# Patient Record
Sex: Male | Born: 1965 | Race: White | Hispanic: No | Marital: Married | Smoking: Never smoker
Health system: Southern US, Community
[De-identification: ages and names within clinical notes are randomized; demographics above are authoritative.]

## PROBLEM LIST (undated history)

## (undated) DIAGNOSIS — K5190 Ulcerative colitis, unspecified, without complications: Secondary | ICD-10-CM

---

## 2017-06-06 ENCOUNTER — Emergency Department (HOSPITAL_COMMUNITY)
Admission: EM | Admit: 2017-06-06 | Discharge: 2017-06-06 | Disposition: A | Discharge status: Home / Self Care | Payer: 59 | Attending: Emergency Medicine | Admitting: Emergency Medicine

## 2017-06-06 ENCOUNTER — Encounter (HOSPITAL_COMMUNITY): Payer: Self-pay

## 2017-06-06 DIAGNOSIS — Y999 Unspecified external cause status: Secondary | ICD-10-CM | POA: Insufficient documentation

## 2017-06-06 DIAGNOSIS — S0031XA Abrasion of nose, initial encounter: Secondary | ICD-10-CM | POA: Insufficient documentation

## 2017-06-06 DIAGNOSIS — Y929 Unspecified place or not applicable: Secondary | ICD-10-CM | POA: Diagnosis not present

## 2017-06-06 DIAGNOSIS — Y939 Activity, unspecified: Secondary | ICD-10-CM | POA: Insufficient documentation

## 2017-06-06 DIAGNOSIS — W548XXA Other contact with dog, initial encounter: Secondary | ICD-10-CM | POA: Insufficient documentation

## 2017-06-06 DIAGNOSIS — S0992XA Unspecified injury of nose, initial encounter: Secondary | ICD-10-CM | POA: Diagnosis present

## 2017-06-06 HISTORY — DX: Ulcerative colitis, unspecified, without complications: K51.90

## 2017-06-06 MED ORDER — AMOXICILLIN-POT CLAVULANATE 875-125 MG PO TABS: 1.0000 | ORAL_TABLET | Freq: Two times a day (BID) | ORAL | 0 refills | Status: DC

## 2017-06-06 NOTE — ED Triage Notes (Signed)
Per Pt, Pt was playing with puppy when he reached up with his claw and ripped through part of the pt's nose. Bleeding is controlled.

## 2017-06-06 NOTE — ED Provider Notes (Signed)
[facility] Provider Note   CSN: [id number] Arrival date & time: 06/06/17  1705     History   Chief Complaint Chief Complaint  Patient presents with  . Facial Laceration    HPI Ricardo Patterson is a 51 y.o. male.  The history is provided by the patient. No language interpreter was used.    Ricardo Patterson is a [age] y.o. male who presents to the Emergency Department complaining of nose laceration.  He was playing with his puppy that is fully immunized and sustained a cut to his nose about 2 hours prior to ED arrival.  He states that the puppy snags the side of his nose and he is not sure if it was with a tooth or a claw.  His tetanus shot was updated 2 years ago.  He has no medical problems.  He does have an aspirin allergy.  Symptoms are mild to moderate in nature.  Past Medical History:  Diagnosis Date  . Ulcerative colitis (HCC)     There are no active problems to display for this patient.   History reviewed. No pertinent surgical history.     Home Medications    Prior to Admission medications   Medication Sig Start Date End Date Taking? Authorizing Provider  amoxicillin-clavulanate (AUGMENTIN) 875-125 MG tablet Take 1 tablet every 12 (twelve) hours by mouth. 06/06/17   Tilden Fossaees, Marney Treloar, MD    Family History No family history on file.  Social History Social History   Tobacco Use  . Smoking status: Never Smoker  . Smokeless tobacco: Never Used  Substance Use Topics  . Alcohol use: No    Frequency: Never  . Drug use: No     Allergies   Aspirin   Review of Systems Review of Systems  All other systems reviewed and are negative.    Physical Exam Updated Vital Signs BP 106/78 (BP Location: Right Arm)   Pulse 70   Temp 97.9 F (36.6 C) (Oral)   Resp 18   Ht 5\' 8"  (1.727 m)   Wt 68 kg (150 lb)   SpO2 100%   BMI 22.81 kg/m   Physical Exam  Constitutional: He is oriented to person, place, and time. He  appears well-developed and well-nourished.  HENT:  Head: Normocephalic.  Mouth/Throat: Oropharynx is clear and moist.  Small laceration to the anterior nasal septum on the right.  Laceration is linear and less than half a centimeter in length and hemostatic with wound margins well approximated.  Eyes: EOM are normal. Pupils are equal, round, and reactive to light.  Neck: Neck supple.  Cardiovascular: Normal rate and regular rhythm.  Pulmonary/Chest: Effort normal. No respiratory distress.  Musculoskeletal: Normal range of motion.  Neurological: He is alert and oriented to person, place, and time.  Skin: Skin is warm.  Psychiatric: He has a normal mood and affect. His behavior is normal.  Nursing note and vitals reviewed.    ED Treatments / Results  Labs (all labs ordered are listed, but only abnormal results are displayed) Labs Reviewed - No data to display  EKG  EKG Interpretation None       Radiology No results found.  Procedures Procedures (including critical care time)  Medications Ordered in ED Medications - No data to display   Initial Impression / Assessment and Plan / ED Course  I have reviewed the triage vital signs and the nursing notes.  Pertinent labs & imaging results that were available during  my care of the patient were reviewed by me and considered in my medical decision making (see chart for details).     Patient here for evaluation of nasal injury following dog bite/scratch.  Wound was cleansed in the department with soap and water as well as chlorhexidine.  Margins are well approximated and wound is hemostatic.  Discussed with patient local wound care with topical bacitracin to the affected area twice daily until wound heals.  Wound does not need closed at this time.  Given it is unclear if this was a scratch or bite he will be placed on prophylactic antibiotics.  Discussed close return precautions for any evidence of developing infection.  Final  Clinical Impressions(s) / ED Diagnoses   Final diagnoses:  Abrasion of nose, initial encounter    ED Discharge Orders        Ordered    amoxicillin-clavulanate (AUGMENTIN) 875-125 MG tablet  Every 12 hours     06/06/17 1728       Tilden Fossaees, Nimai Burbach, MD 06/06/17 1732

## 2017-06-06 NOTE — ED Notes (Signed)
See MD assessment. 

## 2017-08-15 ENCOUNTER — Encounter (HOSPITAL_COMMUNITY): Payer: Self-pay | Admitting: Emergency Medicine

## 2017-08-15 DIAGNOSIS — E860 Dehydration: Secondary | ICD-10-CM | POA: Diagnosis present

## 2017-08-15 DIAGNOSIS — R3129 Other microscopic hematuria: Secondary | ICD-10-CM | POA: Diagnosis present

## 2017-08-15 DIAGNOSIS — Z23 Encounter for immunization: Secondary | ICD-10-CM

## 2017-08-15 DIAGNOSIS — A419 Sepsis, unspecified organism: Principal | ICD-10-CM | POA: Diagnosis present

## 2017-08-15 DIAGNOSIS — J019 Acute sinusitis, unspecified: Secondary | ICD-10-CM | POA: Diagnosis not present

## 2017-08-15 DIAGNOSIS — D649 Anemia, unspecified: Secondary | ICD-10-CM | POA: Diagnosis present

## 2017-08-15 DIAGNOSIS — Z886 Allergy status to analgesic agent status: Secondary | ICD-10-CM | POA: Diagnosis not present

## 2017-08-15 LAB — CBC
HEMATOCRIT: 41.6 % (ref 39.0–52.0)
HEMOGLOBIN: 14.2 g/dL (ref 13.0–17.0)
MCH: 28.9 pg (ref 26.0–34.0)
MCHC: 34.1 g/dL (ref 30.0–36.0)
MCV: 84.7 fL (ref 78.0–100.0)
Platelets: 459 10*3/uL — ABNORMAL HIGH (ref 150–400)
RBC: 4.91 MIL/uL (ref 4.22–5.81)
RDW: 14.2 % (ref 11.5–15.5)
WBC: 18.3 10*3/uL — ABNORMAL HIGH (ref 4.0–10.5)

## 2017-08-15 LAB — I-STAT CG4 LACTIC ACID, ED: LACTIC ACID, VENOUS: 2.67 mmol/L — AB (ref 0.5–1.9)

## 2017-08-15 MED ORDER — ACETAMINOPHEN 325 MG PO TABS
650.0000 mg | ORAL_TABLET | Freq: Once | ORAL | Status: AC | PRN
  Administered 2017-08-15: 650 mg via ORAL
  Filled 2017-08-15: qty 2

## 2017-08-15 MED ORDER — ALBUTEROL SULFATE (2.5 MG/3ML) 0.083% IN NEBU
5.0000 mg | INHALATION_SOLUTION | Freq: Once | RESPIRATORY_TRACT | Status: AC
  Administered 2017-08-15: 5 mg via RESPIRATORY_TRACT
  Filled 2017-08-15: qty 6

## 2017-08-15 NOTE — ED Triage Notes (Signed)
Reports having fever, chills, runny nose, cough, chest congestion, chest pain, SOB, and headache for a week.  Reports going to PCP and was told he had a cold to push fluids.  Reports he's not getting any better.  Noted to be dyspneic at rest with elevated temp 101.3 in triage.

## 2017-08-16 ENCOUNTER — Emergency Department (HOSPITAL_COMMUNITY): Payer: 59

## 2017-08-16 ENCOUNTER — Other Ambulatory Visit: Payer: Self-pay

## 2017-08-16 ENCOUNTER — Observation Stay (HOSPITAL_COMMUNITY)
Admission: EM | Admit: 2017-08-16 | Discharge: 2017-08-19 | DRG: 872 | Disposition: A | Discharge status: Home / Self Care | Payer: 59 | Attending: Internal Medicine | Admitting: Internal Medicine

## 2017-08-16 DIAGNOSIS — J069 Acute upper respiratory infection, unspecified: Secondary | ICD-10-CM | POA: Diagnosis not present

## 2017-08-16 DIAGNOSIS — R69 Illness, unspecified: Secondary | ICD-10-CM | POA: Diagnosis not present

## 2017-08-16 DIAGNOSIS — J111 Influenza due to unidentified influenza virus with other respiratory manifestations: Secondary | ICD-10-CM | POA: Diagnosis present

## 2017-08-16 DIAGNOSIS — I959 Hypotension, unspecified: Secondary | ICD-10-CM

## 2017-08-16 DIAGNOSIS — A419 Sepsis, unspecified organism: Secondary | ICD-10-CM | POA: Diagnosis not present

## 2017-08-16 DIAGNOSIS — R6510 Systemic inflammatory response syndrome (SIRS) of non-infectious origin without acute organ dysfunction: Secondary | ICD-10-CM

## 2017-08-16 LAB — URINALYSIS, ROUTINE W REFLEX MICROSCOPIC
BACTERIA UA: NONE SEEN
GLUCOSE, UA: NEGATIVE mg/dL
Hgb urine dipstick: NEGATIVE
KETONES UR: 5 mg/dL — AB
Nitrite: NEGATIVE
PROTEIN: 30 mg/dL — AB
Specific Gravity, Urine: 1.035 — ABNORMAL HIGH (ref 1.005–1.030)
pH: 7 (ref 5.0–8.0)

## 2017-08-16 LAB — BASIC METABOLIC PANEL
Anion gap: 13 (ref 5–15)
BUN: 11 mg/dL (ref 6–20)
CO2: 22 mmol/L (ref 22–32)
Calcium: 9.1 mg/dL (ref 8.9–10.3)
Chloride: 101 mmol/L (ref 101–111)
Creatinine, Ser: 1.09 mg/dL (ref 0.61–1.24)
GFR calc Af Amer: >60 mL/min (ref >60)
GFR calc non Af Amer: >60 mL/min (ref >60)
GLUCOSE: 91 mg/dL (ref 65–99)
POTASSIUM: 3.6 mmol/L (ref 3.5–5.1)
Sodium: 136 mmol/L (ref 135–145)

## 2017-08-16 LAB — RESPIRATORY PANEL BY PCR
ADENOVIRUS-RVPPCR: NOT DETECTED
Bordetella pertussis: NOT DETECTED
CORONAVIRUS 229E-RVPPCR: NOT DETECTED
CORONAVIRUS OC43-RVPPCR: NOT DETECTED
Chlamydophila pneumoniae: NOT DETECTED
Coronavirus HKU1: NOT DETECTED
Coronavirus NL63: NOT DETECTED
Influenza A: NOT DETECTED
Influenza B: NOT DETECTED
MYCOPLASMA PNEUMONIAE-RVPPCR: NOT DETECTED
Metapneumovirus: NOT DETECTED
PARAINFLUENZA VIRUS 1-RVPPCR: NOT DETECTED
Parainfluenza Virus 2: NOT DETECTED
Parainfluenza Virus 3: NOT DETECTED
Parainfluenza Virus 4: NOT DETECTED
RESPIRATORY SYNCYTIAL VIRUS-RVPPCR: NOT DETECTED
Rhinovirus / Enterovirus: NOT DETECTED

## 2017-08-16 LAB — HEPATIC FUNCTION PANEL
ALT: 41 U/L (ref 17–63)
AST: 40 U/L (ref 15–41)
Albumin: 3.3 g/dL — ABNORMAL LOW (ref 3.5–5.0)
Alkaline Phosphatase: 67 U/L (ref 38–126)
BILIRUBIN INDIRECT: 0.5 mg/dL (ref 0.3–0.9)
Bilirubin, Direct: 0.2 mg/dL (ref 0.1–0.5)
TOTAL PROTEIN: 8 g/dL (ref 6.5–8.1)
Total Bilirubin: 0.7 mg/dL (ref 0.3–1.2)

## 2017-08-16 LAB — INFLUENZA PANEL BY PCR (TYPE A & B)
INFLBPCR: NEGATIVE
Influenza A By PCR: NEGATIVE

## 2017-08-16 LAB — DIFFERENTIAL
BASOS PCT: 1 %
Basophils Absolute: 0.1 10*3/uL (ref 0.0–0.1)
Eosinophils Absolute: 0.3 10*3/uL (ref 0.0–0.7)
Eosinophils Relative: 1 %
Lymphocytes Relative: 19 %
Lymphs Abs: 3.6 10*3/uL (ref 0.7–4.0)
MONO ABS: 2 10*3/uL — AB (ref 0.1–1.0)
Monocytes Relative: 10 %
NEUTROS ABS: 13.1 10*3/uL — AB (ref 1.7–7.7)
NEUTROS PCT: 69 %

## 2017-08-16 LAB — PROCALCITONIN: Procalcitonin: 4.06 ng/mL

## 2017-08-16 LAB — I-STAT CG4 LACTIC ACID, ED: LACTIC ACID, VENOUS: 0.93 mmol/L (ref 0.5–1.9)

## 2017-08-16 LAB — LIPASE, BLOOD: Lipase: 23 U/L (ref 11–51)

## 2017-08-16 MED ORDER — INFLUENZA VAC SPLIT QUAD 0.5 ML IM SUSY
0.5000 mL | PREFILLED_SYRINGE | INTRAMUSCULAR | Status: AC
  Administered 2017-08-19: 0.5 mL via INTRAMUSCULAR
  Filled 2017-08-16: qty 0.5

## 2017-08-16 MED ORDER — ALBUTEROL SULFATE (2.5 MG/3ML) 0.083% IN NEBU: 2.5000 mg | INHALATION_SOLUTION | RESPIRATORY_TRACT | Status: DC | PRN

## 2017-08-16 MED ORDER — PIPERACILLIN-TAZOBACTAM 3.375 G IVPB: 3.3750 g | Freq: Three times a day (TID) | INTRAVENOUS | Status: DC

## 2017-08-16 MED ORDER — PIPERACILLIN-TAZOBACTAM 3.375 G IVPB 30 MIN
3.3750 g | Freq: Once | INTRAVENOUS | Status: AC
  Administered 2017-08-16: 3.375 g via INTRAVENOUS
  Filled 2017-08-16: qty 50

## 2017-08-16 MED ORDER — ONDANSETRON HCL 4 MG/2ML IJ SOLN
4.0000 mg | Freq: Four times a day (QID) | INTRAMUSCULAR | Status: DC | PRN
Start: 2017-08-16 — End: 2017-08-19
  Administered 2017-08-17: 4 mg via INTRAVENOUS
  Filled 2017-08-16: qty 2

## 2017-08-16 MED ORDER — ACETAMINOPHEN 325 MG PO TABS
650.0000 mg | ORAL_TABLET | Freq: Four times a day (QID) | ORAL | Status: DC | PRN
  Administered 2017-08-16 – 2017-08-19 (×5): 650 mg via ORAL
  Filled 2017-08-16 (×6): qty 2

## 2017-08-16 MED ORDER — LEVOFLOXACIN 750 MG PO TABS: 750.0000 mg | ORAL_TABLET | Freq: Once | ORAL | Status: DC

## 2017-08-16 MED ORDER — ENOXAPARIN SODIUM 40 MG/0.4ML ~~LOC~~ SOLN
40.0000 mg | SUBCUTANEOUS | Status: DC
  Administered 2017-08-16 – 2017-08-18 (×3): 40 mg via SUBCUTANEOUS
  Filled 2017-08-16 (×4): qty 0.4

## 2017-08-16 MED ORDER — SODIUM CHLORIDE 0.9 % IV BOLUS (SEPSIS)
1000.0000 mL | Freq: Once | INTRAVENOUS | Status: AC
  Administered 2017-08-16: 1000 mL via INTRAVENOUS

## 2017-08-16 MED ORDER — KETOROLAC TROMETHAMINE 30 MG/ML IJ SOLN
30.0000 mg | Freq: Once | INTRAMUSCULAR | Status: AC
  Administered 2017-08-16: 30 mg via INTRAVENOUS
  Filled 2017-08-16: qty 1

## 2017-08-16 MED ORDER — VANCOMYCIN HCL IN DEXTROSE 750-5 MG/150ML-% IV SOLN
750.0000 mg | Freq: Two times a day (BID) | INTRAVENOUS | Status: DC
  Filled 2017-08-16: qty 150

## 2017-08-16 MED ORDER — SODIUM CHLORIDE 0.9 % IV SOLN
1500.0000 mg | Freq: Once | INTRAVENOUS | Status: AC
  Administered 2017-08-16: 1500 mg via INTRAVENOUS
  Filled 2017-08-16: qty 1500

## 2017-08-16 MED ORDER — ONDANSETRON HCL 4 MG PO TABS: 4.0000 mg | ORAL_TABLET | Freq: Four times a day (QID) | ORAL | Status: DC | PRN

## 2017-08-16 MED ORDER — ONDANSETRON HCL 4 MG/2ML IJ SOLN
4.0000 mg | Freq: Once | INTRAMUSCULAR | Status: AC
  Administered 2017-08-16: 4 mg via INTRAVENOUS
  Filled 2017-08-16: qty 2

## 2017-08-16 MED ORDER — VANCOMYCIN HCL IN DEXTROSE 1-5 GM/200ML-% IV SOLN: 1000.0000 mg | Freq: Once | INTRAVENOUS | Status: DC

## 2017-08-16 MED ORDER — ACETAMINOPHEN 650 MG RE SUPP
650.0000 mg | Freq: Four times a day (QID) | RECTAL | Status: DC | PRN
  Filled 2017-08-16 (×2): qty 1

## 2017-08-16 MED ORDER — LEVOFLOXACIN 750 MG PO TABS: 750.0000 mg | ORAL_TABLET | Freq: Every day | ORAL | 0 refills | Status: DC

## 2017-08-16 MED ORDER — SODIUM CHLORIDE 0.9 % IV SOLN
INTRAVENOUS | Status: DC
  Administered 2017-08-16 – 2017-08-18 (×7): via INTRAVENOUS

## 2017-08-16 NOTE — ED Notes (Signed)
Pt ambulated to restroom, tolerated well.

## 2017-08-16 NOTE — ED Notes (Signed)
Spoke with MD regarding downgrading pt's bed, plan is to change pt from stepdown to med surg r/t VS WDL

## 2017-08-16 NOTE — H&P (Signed)
History and Physical    Ricardo Patterson:[id number] DOB: August 25, 1965 DOA: 08/16/2017  PCP: Patient, [name]  Patient coming from: Home  I have personally briefly reviewed patient's old medical records in [facility]  Chief Complaint: ILI  HPI: Ricardo Patterson is a 52 y.o. male with medical history significant of UC, not on any meds for this.  Patient presents to the ED with c/o 15 day history of persistent cough productive of yellow sputum, fevers, intermittent headache, body aches, nausea, vomiting.  Abdomen feels "sore" from the vomiting but not really painful.  No diarrhea.  Daughter with similar symptoms but recovered after several days.  No recent travel.  No flu vaccine this year.   ED Course: Flu is negative.  CXR neg.  Lactate 2.67 2L IVF bolus and cleared to 0.93.  Despite 2L IVF bolus, clearing of fever with tylenol and toradol, resolution of headache.  Patients BP trended down.  Zosyn and vanc ordered, 3rd L NS bolus ordered, and IM called to admit.   Review of Systems: As per HPI otherwise 10 point review of systems negative.   Past Medical History:  Diagnosis Date  . Ulcerative colitis (HCC)     History reviewed. No pertinent surgical history.   reports that  has never smoked. he has never used smokeless tobacco. He reports that he does not drink alcohol or use drugs.  Allergies  Allergen Reactions  . Aspirin     No family history on file. Daughter with similar symptoms last week, but cleared after a few days.  Prior to Admission medications   Medication Sig Start Date End Date Taking? Authorizing Provider  amoxicillin-clavulanate (AUGMENTIN) 875-125 MG tablet Take 1 tablet every 12 (twelve) hours by mouth. Patient not taking: Reported on 08/16/2017 06/06/17   Tilden Fossa, MD  levofloxacin (LEVAQUIN) 750 MG tablet Take 1 tablet (750 mg total) by mouth daily. 08/16/17   Ward, Layla Maw, DO    Physical Exam: Vitals:   08/16/17 0330 08/16/17  0415 08/16/17 0430 08/16/17 0449  BP: 102/65 98/64 97/64  (!) 93/58  Pulse: 73 70 75 83  Resp: (!) 21 18 (!) 21 19  Temp:      TempSrc:      SpO2: 95% 95% 97% 96%  Weight:      Height:        Constitutional: NAD, calm, comfortable Eyes: PERRL, lids and conjunctivae normal ENMT: Mucous membranes are moist. Posterior pharynx clear of any exudate or lesions.Normal dentition.  Neck: normal, supple, no masses, no thyromegaly Respiratory: clear to auscultation bilaterally, no wheezing, no crackles. Normal respiratory effort. No accessory muscle use.  Cardiovascular: Regular rate and rhythm, no murmurs / rubs / gallops. No extremity edema. 2+ pedal pulses. No carotid bruits.  Abdomen: no tenderness, no masses palpated. No hepatosplenomegaly. Bowel sounds positive.  Musculoskeletal: no clubbing / cyanosis. No joint deformity upper and lower extremities. Good ROM, no contractures. Normal muscle tone.  Skin: no rashes, lesions, ulcers. No induration Neurologic: CN 2-12 grossly intact. Sensation intact, DTR normal. Strength 5/5 in all 4.  Psychiatric: Normal judgment and insight. Alert and oriented x 3. Normal mood.    Labs on Admission: I have personally reviewed following labs and imaging studies  CBC: Recent Labs  Lab 08/15/17 2341 08/16/17 0026  WBC 18.3*  --   NEUTROABS  --  13.1*  HGB 14.2  --   HCT 41.6  --   MCV 84.7  --   PLT  459*  --    Basic Metabolic Panel: Recent Labs  Lab 08/15/17 2341  NA 136  K 3.6  CL 101  CO2 22  GLUCOSE 91  BUN 11  CREATININE 1.09  CALCIUM 9.1   GFR: Estimated Creatinine Clearance: 78.8 mL/min (by C-G formula based on SCr of 1.09 mg/dL). Liver Function Tests: Recent Labs  Lab 08/16/17 0026  AST 40  ALT 41  ALKPHOS 67  BILITOT 0.7  PROT 8.0  ALBUMIN 3.3*   Recent Labs  Lab 08/16/17 0048  LIPASE 23   No results for input(s): AMMONIA in the last 168 hours. Coagulation Profile: No results for input(s): INR, PROTIME in the last  168 hours. Cardiac Enzymes: No results for input(s): CKTOTAL, CKMB, CKMBINDEX, TROPONINI in the last 168 hours. BNP (last 3 results) No results for input(s): PROBNP in the last 8760 hours. HbA1C: No results for input(s): HGBA1C in the last 72 hours. CBG: No results for input(s): GLUCAP in the last 168 hours. Lipid Profile: No results for input(s): CHOL, HDL, LDLCALC, TRIG, CHOLHDL, LDLDIRECT in the last 72 hours. Thyroid Function Tests: No results for input(s): TSH, T4TOTAL, FREET4, T3FREE, THYROIDAB in the last 72 hours. Anemia Panel: No results for input(s): VITAMINB12, FOLATE, FERRITIN, TIBC, IRON, RETICCTPCT in the last 72 hours. Urine analysis:    Component Value Date/Time   COLORURINE AMBER (A) 08/16/2017 0358   APPEARANCEUR HAZY (A) 08/16/2017 0358   LABSPEC 1.035 (H) 08/16/2017 0358   PHURINE 7.0 08/16/2017 0358   GLUCOSEU NEGATIVE 08/16/2017 0358   HGBUR NEGATIVE 08/16/2017 0358   BILIRUBINUR SMALL (A) 08/16/2017 0358   KETONESUR 5 (A) 08/16/2017 0358   PROTEINUR 30 (A) 08/16/2017 0358   NITRITE NEGATIVE 08/16/2017 0358   LEUKOCYTESUR SMALL (A) 08/16/2017 0358    Radiological Exams on Admission: Dg Chest 2 View  Result Date: 08/16/2017 CLINICAL DATA:  Flu-like symptoms for 2 weeks. EXAM: CHEST  2 VIEW COMPARISON:  None. FINDINGS: The heart size and mediastinal contours are within normal limits. Both lungs are clear. The visualized skeletal structures are unremarkable. IMPRESSION: No active cardiopulmonary disease.  No evidence of pneumonia. Electronically Signed   By: Bary RichardStan  Maynard M.D.   On: 08/16/2017 01:39    EKG: Independently reviewed.  Assessment/Plan Principal Problem:   Influenza-like illness Active Problems:   Sepsis (HCC)   Sepsis associated hypotension (HCC)    1. ILI - concerningly with sepsis and now sepsis associated hypotension 1. Underlying PNA not seen on CXR due to initial dehydration on presentation? 2. Sepsis pathway initiated 3. IVF: 2L  bolus in thus far, 3rd L going now, 125 cc/hr maint ordered 4. Will leave patient on zosyn and vanc for now given the downward trend in BPs despite IVF which is quite concerning. 5. Respiratory virus panel pending, flu was neg. 6. BCx pending 7. Sputum culture and HIV pending 8. Pro calcitonin pending  DVT prophylaxis: Lovenox Code Status: Full Family Communication: Family at bedside Disposition Plan: Home after admit Consults called: None Admission status: Admit to inpatient - inpatient status for sepsis, sepsis associated hypotension that seems to be worsening despite initial treatment.   Hillary BowGARDNER, JARED M. DO [facility] Pager [phone]  If 7AM-7PM, please contact day team taking care of patient [email or web address] Password [email or web address]  08/16/2017, 5:45 AM

## 2017-08-16 NOTE — Progress Notes (Addendum)
  [place] TEAM 1 - Stepdown/ICU TEAM  Ricardo Patterson  HQI:[id number] DOB: June 28, 1966 DOA: 08/16/2017 PCP: Patient, [name]    Brief Narrative:  52 y.o. male with a history of UC not on any meds who presented to the ED with a 15 day history of persistent cough productive of yellow sputum, fevers, intermittent headache, body aches, nausea, and vomiting.  He tested negative for influenza in the ED.    Subjective: Pt is seen for a f/u visit.    Assessment & Plan:  Influenza type illness Flu negative - procalcitonin 4 - RVP and HIV pending - CXR clear - stop abx as there is not clinical evidence suggestive of a bacterial infection   Lactic acidosis  Resolved w/ volume expansion   Hx of UC  DVT prophylaxis: lovenox  Code Status: FULL CODE Family Communication: no family present at time of exam  Disposition Plan: med/surg - possible d/c home within 24hrs  Consultants:  none  Procedures: none  Antimicrobials:  Zosyn 1/27  Vanc 1/28   Objective: Blood pressure 100/74, pulse 77, temperature (!) 100.7 F (38.2 C), temperature source Oral, resp. rate 13, height 5\' 8"  (1.727 m), weight 68 kg (150 lb), SpO2 99 %.  Intake/Output Summary (Last 24 hours) at 08/16/2017 1118 Last data filed at 08/16/2017 0659 Gross per 24 hour  Intake 5050 ml  Output -  Net 5050 ml   Filed Weights   08/15/17 2335  Weight: 68 kg (150 lb)    Examination: Pt was seen for a f/u visit.    CBC: Recent Labs  Lab 08/15/17 2341 08/16/17 0026  WBC 18.3*  --   NEUTROABS  --  13.1*  HGB 14.2  --   HCT 41.6  --   MCV 84.7  --   PLT 459*  --    Basic Metabolic Panel: Recent Labs  Lab 08/15/17 2341  NA 136  K 3.6  CL 101  CO2 22  GLUCOSE 91  BUN 11  CREATININE 1.09  CALCIUM 9.1   GFR: Estimated Creatinine Clearance: 78.8 mL/min (by C-G formula based on SCr of 1.09 mg/dL).  Liver Function Tests: Recent Labs  Lab 08/16/17 0026  AST 40  ALT 41  ALKPHOS 67  BILITOT 0.7  PROT 8.0   ALBUMIN 3.3*   Recent Labs  Lab 08/16/17 0048  LIPASE 23     Scheduled Meds: . enoxaparin (LOVENOX) injection  40 mg Subcutaneous Q24H   Continuous Infusions: . sodium chloride 150 mL/hr at 08/16/17 1006  . piperacillin-tazobactam (ZOSYN)  IV    . vancomycin       LOS: 0 days   Time spent: No Charge  Lonia BloodJeffrey T. McClung, MD [facility] Office  [phone] Pager - Text Page per Amion as per below:  On-Call/Text Page:      [email or web address]      password [email or web address]  If 7PM-7AM, please contact night-coverage [email or web address] Password [email or web address] 08/16/2017, 11:18 AM

## 2017-08-16 NOTE — ED Notes (Signed)
Pt attempting to urinate.

## 2017-08-16 NOTE — ED Provider Notes (Addendum)
TIME SEEN: 12:24 AM  CHIEF COMPLAINT: Fever, cough  HPI: Patient is a 52 year old male with history of ulcerative colitis not on medications who presents to the emergency department with 15 days of fevers, productive cough with yellow sputum.  Reports feeling short of breath.  Also complains of headache, body aches, nausea and vomiting.  States his abdomen feels "sore".  No diarrhea.  Daughter with similar symptoms but recovered after several days.  No recent travel.  Did not have an influenza vaccination this year.  ROS: See HPI Constitutional: no fever  Eyes: no drainage  ENT: no runny nose   Cardiovascular:  no chest pain  Resp: no SOB  GI: no vomiting GU: no dysuria Integumentary: no rash  Allergy: no hives  Musculoskeletal: no leg swelling  Neurological: no slurred speech ROS otherwise negative  PAST MEDICAL HISTORY/PAST SURGICAL HISTORY:  Past Medical History:  Diagnosis Date  . Ulcerative colitis (HCC)     MEDICATIONS:  Prior to Admission medications   Medication Sig Start Date End Date Taking? Authorizing Provider  amoxicillin-clavulanate (AUGMENTIN) 875-125 MG tablet Take 1 tablet every 12 (twelve) hours by mouth. 06/06/17   Tilden Fossa, MD    ALLERGIES:  Allergies  Allergen Reactions  . Aspirin     SOCIAL HISTORY:  Social History   Tobacco Use  . Smoking status: Never Smoker  . Smokeless tobacco: Never Used  Substance Use Topics  . Alcohol use: No    Frequency: Never    FAMILY HISTORY: No family history on file.  EXAM: BP 134/75 (BP Location: Right Arm)   Pulse (!) 106   Temp (!) 101.3 F (38.5 C)   Resp (!) 22   Ht 5\' 8"  (1.727 m)   Wt 68 kg (150 lb)   SpO2 100%   BMI 22.81 kg/m  CONSTITUTIONAL: Alert and oriented and responds appropriately to questions. Well-appearing; well-nourished HEAD: Normocephalic EYES: Conjunctivae clear, pupils appear equal, EOMI ENT: normal nose; moist mucous membranes; No pharyngeal erythema or petechiae, no  tonsillar hypertrophy or exudate, no uvular deviation, no unilateral swelling, no trismus or drooling, no muffled voice, normal phonation, no stridor, no dental caries present, no drainable dental abscess noted, no Ludwig's angina, tongue sits flat in the bottom of the mouth, no angioedema, no facial erythema or warmth, no facial swelling; no pain with movement of the neck. NECK: Supple, no meningismus, no nuchal rigidity, no LAD  CARD: Regular and tachycardic; S1 and S2 appreciated; no murmurs, no clicks, no rubs, no gallops RESP: Normal chest excursion without splinting or tachypnea; breath sounds clear and equal bilaterally; no wheezes, no rhonchi, no rales, no hypoxia or respiratory distress, speaking full sentences ABD/GI: Normal bowel sounds; non-distended; soft, mildly tender to palpation throughout the abdomen, no rebound, no guarding, no peritoneal signs, no hepatosplenomegaly BACK:  The back appears normal and is non-tender to palpation, there is no CVA tenderness EXT: Normal ROM in all joints; non-tender to palpation; no edema; normal capillary refill; no cyanosis, no calf tenderness or swelling    SKIN: Normal color for age and race; warm; no rash NEURO: Moves all extremities equally PSYCH: The patient's mood and manner are appropriate. Grooming and personal hygiene are appropriate.  MEDICAL DECISION MAKING: Patient here with complaints of fevers, cough for the past 15 days.  Feels like he is not significantly improving.  Was told by his PCP that this was likely viral illness.  Here he is febrile, tachycardic and has a leukocytosis of 18,000 with left  shift.  Will obtain chest x-ray, blood cultures, urine.  Patient is very apprehensive about pain medication, nausea medicine or antibiotics at this time.  He would like to wait for his results.  He does not appear toxic on exam.  He agrees to IV hydration.  Lactate mildly elevated.  Lungs are currently clear with no hypoxia or respiratory  distress.  Abdomen mildly tender diffusely but he states he thinks this is from vomiting so much and states his abdomen does not hurt if I am not touching it.  We will also check influenza swab today.  No meningismus on exam.  ED PROGRESS: Patient now agreeable to medications for discomfort and nausea.  Will give Toradol, Zofran.  Urine shows 6-30 red blood cells, 6-30 white blood cells but no bacteria.  Repeat lactate has improved.  His flu swab is negative.  Chest x-ray clear but clinically I feel he could have pneumonia.  Urine culture has been sent.  Again doubt meningitis.  No signs of cellulitis.  Blood cultures are pending.  His blood pressures despite 2 L of fluids are now 90s over 50s.  States this is lower than what he normally runs.  He does meet SIRs criteria.  Will continue IV hydration and give broad-spectrum antibiotics.  Will admit to medicine.  Patient comfortable with this plan.  5:25 AM Discussed patient's case with hospitalist, Dr. Julian ReilGardner.  I have recommended admission and patient (and family if present) agree with this plan. Admitting physician will place admission orders.   I reviewed all nursing notes, vitals, pertinent previous records, EKGs, lab and urine results, imaging (as available).       EKG Interpretation  Date/Time:  Sunday August 15 2017 23:26:20 EST Ventricular Rate:  94 PR Interval:  132 QRS Duration: 72 QT Interval:  306 QTC Calculation: 382 R Axis:   72 Text Interpretation:  Normal sinus rhythm Normal ECG No old tracing to compare Confirmed by Donold Marotto, Baxter HireKristen [id number]) on 08/16/2017 12:24:58 AM        CRITICAL CARE Performed by: Baxter HireKristen Lindzey Zent   Total critical care time: 45 minutes  Critical care time was exclusive of separately billable procedures and treating other patients.  Critical care was necessary to treat or prevent imminent or life-threatening deterioration.  Critical care was time spent personally by me on the following activities:  development of treatment plan with patient and/or surrogate as well as nursing, discussions with consultants, evaluation of patient's response to treatment, examination of patient, obtaining history from patient or surrogate, ordering and performing treatments and interventions, ordering and review of laboratory studies, ordering and review of radiographic studies, pulse oximetry and re-evaluation of patient's condition.    Jaxden Blyden, Layla MawKristen N, DO 08/16/17 0443    Zoiee Wimmer, Layla MawKristen N, DO 08/16/17 [phone]

## 2017-08-16 NOTE — ED Notes (Signed)
ED Provider at bedside. 

## 2017-08-16 NOTE — ED Notes (Signed)
Patient returned from X-ray 

## 2017-08-16 NOTE — Progress Notes (Signed)
Patient arrived to 6n30 alert and oriented, VSS, IV fluids running. Patient reports no pain, comfortable at the moment. Family at bedside, oriented to room and staff, will continue to monitor.

## 2017-08-16 NOTE — Progress Notes (Signed)
Pharmacy Antibiotic Note  Thereasa ParkinStephen J Wesely is a 52 y.o. male admitted on 08/16/2017 with fever, cough and yellow sputum. Starting empiric abx for ?sepsis. WBC 18, LA 2.67 > 0.9, Tm 101.3.  Plan: -Vancomycin 1500 mg IV x1 then 1g/12h -Zosyn 3.375 g IV q8h -Monitor renal fx, cultures, VT as needed  Height: 5\' 8"  (172.7 cm) Weight: 150 lb (68 kg) IBW/kg (Calculated) : 68.4  Temp (24hrs), Avg:101 F (38.3 C), Min:100.7 F (38.2 C), Max:101.3 F (38.5 C)  Recent Labs  Lab 08/15/17 2341 08/15/17 2352 08/16/17 0256  WBC 18.3*  --   --   CREATININE 1.09  --   --   LATICACIDVEN  --  2.67* 0.93    Estimated Creatinine Clearance: 78.8 mL/min (by C-G formula based on SCr of 1.09 mg/dL).    Allergies  Allergen Reactions  . Aspirin     Antimicrobials this admission: 1/28 vancomycin > 1/28 zosyn >  Dose adjustments this admission: N/A  Microbiology results: 1/28 resp cx: 1/28 urine cx: 1/28 blood cx:  Baldemar FridayMasters, Cheree Fowles M 08/16/2017 5:32 AM

## 2017-08-16 NOTE — ED Notes (Signed)
Pharmacy messaged about sending Vancomycin

## 2017-08-16 NOTE — ED Notes (Signed)
Provider bedside, plan is to admit pt

## 2017-08-17 ENCOUNTER — Observation Stay (HOSPITAL_COMMUNITY): Payer: 59

## 2017-08-17 DIAGNOSIS — R69 Illness, unspecified: Secondary | ICD-10-CM | POA: Diagnosis not present

## 2017-08-17 LAB — HIV ANTIBODY (ROUTINE TESTING W REFLEX): HIV Screen 4th Generation wRfx: NONREACTIVE

## 2017-08-17 LAB — URINE CULTURE

## 2017-08-17 MED ORDER — BUTALBITAL-APAP-CAFFEINE 50-325-40 MG PO TABS
1.0000 | ORAL_TABLET | Freq: Four times a day (QID) | ORAL | Status: DC | PRN
  Administered 2017-08-17 – 2017-08-18 (×5): 2 via ORAL
  Filled 2017-08-17 (×5): qty 2

## 2017-08-17 MED ORDER — GUAIFENESIN-DM 100-10 MG/5ML PO SYRP
5.0000 mL | ORAL_SOLUTION | ORAL | Status: DC | PRN
  Administered 2017-08-18 (×2): 5 mL via ORAL
  Filled 2017-08-17 (×2): qty 5

## 2017-08-17 MED ORDER — TRAMADOL HCL 50 MG PO TABS
50.0000 mg | ORAL_TABLET | Freq: Four times a day (QID) | ORAL | Status: DC | PRN
  Administered 2017-08-17: 50 mg via ORAL
  Filled 2017-08-17: qty 1

## 2017-08-17 NOTE — Progress Notes (Signed)
[place] TEAM 1 - Stepdown/ICU TEAM  Thereasa ParkinStephen J Champa  WUJ:[id number] DOB: [birth year] DOA: 08/16/2017 PCP: Patient, [name]    Brief Narrative:  52 y.o. male with a history of UC not on any meds who presented to the ED with a 15 day history of persistent cough productive of yellow sputum, fevers, intermittent headache, body aches, nausea, and vomiting.  He tested negative for influenza in the ED.    Subjective: The patient had a fever of 102 last night.  He reports severe nausea and vomiting with inability to tolerate consistent oral intake.  He feels somewhat lightheaded when he attempts to stand.  He reports severe sinus pressure and unrelenting frontal region headache.  He denies chest pain or shortness of breath.  Assessment & Plan:  FUO Flu negative - procalcitonin 4 - RVP negative - HIV negative - CXR clear -symptoms appear to be localizing to the frontal or ethmoid sinuses -check CT sinuses to rule out severe sinusitis which would require antibiotic therapy  Lactic acidosis -severe dehydration Resolved w/ volume expansion, but patient not able to tolerate oral intake and therefore remains dependent upon IV support for now  Hx of UC Quiescent  DVT prophylaxis: lovenox  Code Status: FULL CODE Family Communication: no family present at time of exam  Disposition Plan: Follow-up CT scan sinuses -continue to hydrate until able to consistently take orals  Consultants:  none  Procedures: none  Antimicrobials:  Zosyn 1/27  Vanc 1/28   Objective: Blood pressure 114/72, pulse 72, temperature 99.1 F (37.3 C), temperature source Oral, resp. rate 18, height 5\' 8"  (1.727 m), weight 73 kg (160 lb 15 oz), SpO2 99 %.  Intake/Output Summary (Last 24 hours) at 08/17/2017 1541 Last data filed at 08/17/2017 1518 Gross per 24 hour  Intake 342 ml  Output -  Net 342 ml   Filed Weights   08/15/17 2335 08/16/17 1738  Weight: 68 kg (150 lb) 73 kg (160 lb 15 oz)     Examination: General: No acute respiratory distress - tender over frontal sinuses bilaterally Lungs: Clear to auscultation bilaterally without wheezes or crackles Cardiovascular: Regular rate and rhythm without murmur gallop or rub normal S1 and S2 Abdomen: Nontender, nondistended, soft, bowel sounds positive, no rebound, no ascites, no appreciable mass Extremities: No significant cyanosis, clubbing, or edema bilateral lower extremities    CBC: Recent Labs  Lab 08/15/17 2341 08/16/17 0026  WBC 18.3*  --   NEUTROABS  --  13.1*  HGB 14.2  --   HCT 41.6  --   MCV 84.7  --   PLT 459*  --    Basic Metabolic Panel: Recent Labs  Lab 08/15/17 2341  NA 136  K 3.6  CL 101  CO2 22  GLUCOSE 91  BUN 11  CREATININE 1.09  CALCIUM 9.1   GFR: Estimated Creatinine Clearance: 79.3 mL/min (by C-G formula based on SCr of 1.09 mg/dL).  Liver Function Tests: Recent Labs  Lab 08/16/17 0026  AST 40  ALT 41  ALKPHOS 67  BILITOT 0.7  PROT 8.0  ALBUMIN 3.3*   Recent Labs  Lab 08/16/17 0048  LIPASE 23     Scheduled Meds: . enoxaparin (LOVENOX) injection  40 mg Subcutaneous Q24H  . Influenza vac split quadrivalent PF  0.5 mL Intramuscular Tomorrow-1000   Continuous Infusions: . sodium chloride 125 mL/hr at 08/17/17 0631     LOS: 1 day    Lonia BloodJeffrey T. Vershawn Westrup, MD [facility] Office  [phone] Pager - Text Page per Loretha Stapler as per below:  On-Call/Text Page:      [email or web address]      password [email or web address]  If 7PM-7AM, please contact night-coverage [email or web address] Password [facility] 08/17/2017, 3:41 PM

## 2017-08-18 DIAGNOSIS — J019 Acute sinusitis, unspecified: Secondary | ICD-10-CM | POA: Diagnosis not present

## 2017-08-18 LAB — BASIC METABOLIC PANEL
ANION GAP: 8 (ref 5–15)
BUN: 5 mg/dL — ABNORMAL LOW (ref 6–20)
CALCIUM: 7.9 mg/dL — AB (ref 8.9–10.3)
CO2: 23 mmol/L (ref 22–32)
Chloride: 106 mmol/L (ref 101–111)
Creatinine, Ser: 0.91 mg/dL (ref 0.61–1.24)
GFR calc non Af Amer: >60 mL/min (ref >60)
Glucose, Bld: 111 mg/dL — ABNORMAL HIGH (ref 65–99)
Potassium: 3.8 mmol/L (ref 3.5–5.1)
Sodium: 137 mmol/L (ref 135–145)

## 2017-08-18 LAB — CBC
HCT: 32.6 % — ABNORMAL LOW (ref 39.0–52.0)
Hemoglobin: 10.8 g/dL — ABNORMAL LOW (ref 13.0–17.0)
MCH: 27.8 pg (ref 26.0–34.0)
MCHC: 33.1 g/dL (ref 30.0–36.0)
MCV: 84 fL (ref 78.0–100.0)
PLATELETS: 399 10*3/uL (ref 150–400)
RBC: 3.88 MIL/uL — AB (ref 4.22–5.81)
RDW: 14.1 % (ref 11.5–15.5)
WBC: 17.5 10*3/uL — AB (ref 4.0–10.5)

## 2017-08-18 LAB — PROCALCITONIN: Procalcitonin: 1.87 ng/mL

## 2017-08-18 MED ORDER — FLUTICASONE PROPIONATE 50 MCG/ACT NA SUSP
2.0000 | Freq: Every day | NASAL | Status: DC
Start: 2017-08-18 — End: 2017-08-19
  Administered 2017-08-18 – 2017-08-19 (×2): 2 via NASAL
  Filled 2017-08-18: qty 16

## 2017-08-18 MED ORDER — AMOXICILLIN-POT CLAVULANATE 875-125 MG PO TABS
1.0000 | ORAL_TABLET | Freq: Two times a day (BID) | ORAL | Status: DC
  Administered 2017-08-18 – 2017-08-19 (×3): 1 via ORAL
  Filled 2017-08-18 (×3): qty 1

## 2017-08-18 NOTE — Progress Notes (Signed)
Progress Note  Thereasa ParkinStephen J Patterson  ZOX:[id number] DOB: 1965-10-21 DOA: 08/16/2017 PCP: Ricardo Patterson, [name]    Brief Narrative:  52 y.o. male with a history of UC not on any meds who presented to the ED with a 15 day history of persistent cough productive of yellow sputum, fevers, intermittent headache, body aches, nausea, and vomiting.  He tested negative for influenza in the ED.    Subjective: Reports low-grade fever/100.8 F.  Vomited x2 yesterday and once this morning, nonbloody.  Intermittent bifrontal/periorbital significant headache.  Feels drainage at back of his throat.  Assessment & Plan:  FUO/acute sinusitis Flu negative - procalcitonin 4 - RVP negative - HIV negative - CXR clear -symptoms appear to be localizing to the frontal or ethmoid sinuses  CT maxillofacial confirms findings of acute sinusitis. Discussed with ID and started Augmentin, recommend 10-14 days treatment.  Flonase. Vomiting may be related to postnasal drip from acute sinusitis. Blood cultures x2: Negative to date.  Flu panel PCR negative.  HIV screen negative.  Lactic acidosis -severe dehydration Resolved w/ volume expansion, but Ricardo Patterson not able to tolerate oral intake and therefore remains dependent upon IV support for now Vomiting may be improving.  Monitor in the hospital for additional day to ensure consistent oral intake, defervesce and improvement of his headache prior to discharge in a.m.  Hx of UC Quiescent  Anemia ?  Dilutional.  Follow CBC in a.m.  Reduce IV fluids.  Leukocytosis Likely secondary to acute sinusitis.  Treatment as above.  Follow CBC in a.m.  DVT prophylaxis: lovenox  Code Status: FULL CODE Family Communication: no family present at time of exam  Disposition Plan: Possible discharge home 1/31.  Consultants:  none  Procedures: none  Antimicrobials:  Zosyn 1/27-discontinued Vanc 1/28-discontinued  Oral Augmentin 1/30 >  Objective: Vitals:   08/17/17 2130 08/18/17 0435  08/18/17 0721 08/18/17 1417  BP: 116/74 131/71  116/66  Pulse: 74 72  72  Resp: 18 18  18   Temp: 99.5 F (37.5 C) (!) 100.8 F (38.2 C) 98.9 F (37.2 C) 100 F (37.8 C)  TempSrc: Oral Oral Oral Oral  SpO2: 100% 95%  95%  Weight:      Height:          Intake/Output Summary (Last 24 hours) at 08/18/2017 1727 Last data filed at 08/18/2017 1417 Gross per 24 hour  Intake 2942 ml  Output -  Net 2942 ml   Filed Weights   08/15/17 2335 08/16/17 1738  Weight: 68 kg (150 lb) 73 kg (160 lb 15 oz)    Examination: General: No acute respiratory distress - tender over frontal sinuses bilaterally he does not appear septic or toxic. Lungs: Clear to auscultation bilaterally without wheezes or crackles.  No increased work of breathing. Cardiovascular: Regular rate and rhythm without murmur gallop or rub normal S1 and S2 Abdomen: Nontender, nondistended, soft, bowel sounds positive, no rebound, no ascites, no appreciable mass Extremities: No significant cyanosis, clubbing, or edema bilateral lower extremities CNS: Alert and oriented.  No focal neurological deficits.    CBC: Recent Labs  Lab 08/15/17 2341 08/16/17 0026 08/18/17 0522  WBC 18.3*  --  17.5*  NEUTROABS  --  13.1*  --   HGB 14.2  --  10.8*  HCT 41.6  --  32.6*  MCV 84.7  --  84.0  PLT 459*  --  399   Basic Metabolic Panel: Recent Labs  Lab 08/15/17 2341 08/18/17 0522  NA 136 137  K 3.6 3.8  CL 101 106  CO2 22 23  GLUCOSE 91 111*  BUN 11 5*  CREATININE 1.09 0.91  CALCIUM 9.1 7.9*   GFR: Estimated Creatinine Clearance: 95 mL/min (by C-G formula based on SCr of 0.91 mg/dL).  Liver Function Tests: Recent Labs  Lab 08/16/17 0026  AST 40  ALT 41  ALKPHOS 67  BILITOT 0.7  PROT 8.0  ALBUMIN 3.3*   Recent Labs  Lab 08/16/17 0048  LIPASE 23     Scheduled Meds: . amoxicillin-clavulanate  1 tablet Oral Q12H  . enoxaparin (LOVENOX) injection  40 mg Subcutaneous Q24H  . fluticasone  2 spray Each Nare  Daily  . Influenza vac split quadrivalent PF  0.5 mL Intramuscular Tomorrow-1000   Continuous Infusions: . sodium chloride 125 mL/hr at 08/18/17 0856     LOS: 1 day    Marcellus Scott, MD, FACP, [facility]. [facility] Pager [phone]  If 7PM-7AM, please contact night-coverage [email or web address] Password [facility] 08/18/2017, 5:33 PM

## 2017-08-19 DIAGNOSIS — J019 Acute sinusitis, unspecified: Secondary | ICD-10-CM | POA: Diagnosis not present

## 2017-08-19 LAB — CBC
HCT: 32.6 % — ABNORMAL LOW (ref 39.0–52.0)
Hemoglobin: 10.9 g/dL — ABNORMAL LOW (ref 13.0–17.0)
MCH: 28.2 pg (ref 26.0–34.0)
MCHC: 33.4 g/dL (ref 30.0–36.0)
MCV: 84.2 fL (ref 78.0–100.0)
PLATELETS: 455 10*3/uL — AB (ref 150–400)
RBC: 3.87 MIL/uL — ABNORMAL LOW (ref 4.22–5.81)
RDW: 14 % (ref 11.5–15.5)
WBC: 12.2 10*3/uL — AB (ref 4.0–10.5)

## 2017-08-19 MED ORDER — GUAIFENESIN-DM 100-10 MG/5ML PO SYRP: 5.0000 mL | ORAL_SOLUTION | Freq: Four times a day (QID) | ORAL | 0 refills | Status: AC | PRN

## 2017-08-19 MED ORDER — FLUTICASONE PROPIONATE 50 MCG/ACT NA SUSP: 2.0000 | Freq: Every day | NASAL | 0 refills | Status: AC

## 2017-08-19 MED ORDER — AMOXICILLIN-POT CLAVULANATE 875-125 MG PO TABS: 1.0000 | ORAL_TABLET | Freq: Two times a day (BID) | ORAL | 0 refills | Status: AC

## 2017-08-19 MED ORDER — ONDANSETRON HCL 4 MG PO TABS: 4.0000 mg | ORAL_TABLET | Freq: Three times a day (TID) | ORAL | 0 refills | Status: AC | PRN

## 2017-08-19 MED ORDER — LORATADINE 10 MG PO TABS: 10.0000 mg | ORAL_TABLET | Freq: Every day | ORAL | 0 refills | Status: AC

## 2017-08-19 MED ORDER — IBUPROFEN 800 MG PO TABS: 800.0000 mg | ORAL_TABLET | Freq: Three times a day (TID) | ORAL | 0 refills | Status: AC | PRN

## 2017-08-19 NOTE — Care Management Note (Signed)
Case Management Note  Patient Details  Name: Ricardo ParkinStephen J Patterson MRN: [id number] Date of Birth: 1965/11/21  Subjective/Objective:                    Action/Plan:  Spoke with patient at bedside. Confirmed he has insurance, and no PCP. Explained to patient he can call number on insurance card to be provided with a list of MD in network with his insurance. Patient voiced understanding. [facility] Wednesday August 25, 2017 at 0930 for hospital follow up .  Expected Discharge Date:                  Expected Discharge Plan:  Home/Self Care  In-House Referral:     Discharge planning Services  CM Consult  Post Acute Care Choice:  NA Choice offered to:  Patient  DME Arranged:  N/A DME Agency:  NA  HH Arranged:  NA HH Agency:  NA  Status of Service:  Completed, signed off  If discussed at Long Length of Stay Meetings, dates discussed:    Additional Comments:  Kingsley PlanWile, Webber Michiels Marie, RN 08/19/2017, 9:47 AM

## 2017-08-19 NOTE — Progress Notes (Signed)
Discharge instruction reviewed and given to patient.  Pt had not questions during instructions and edutions.  Pt will go to [facility] to pick up his prescriptions. Pt has his belongings with him. Pt left with his daughter.

## 2017-08-19 NOTE — Discharge Summary (Addendum)
Physician Discharge Summary  Ricardo Patterson ZOX:[id number] DOB: 06-13-1966  PCP: Patient, [name]  Admit date: 08/16/2017 Discharge date: 08/19/2017  Recommendations for Outpatient Follow-up:  1. Joaquin Courts, FNP/PCP (new) on 08/25/17 at 9:30 AM.  To be seen with repeat labs (CBC & BMP).  Please follow final blood culture results that were sent from the hospital. 2. Recommend repeating urine microscopy in 1-2 weeks.  Please see discussion below.  Home Health: None Equipment/Devices: None  Discharge Condition: Improved and stable CODE STATUS: Full Diet recommendation: Heart healthy diet.  Discharge Diagnoses:  Principal Problem:   Influenza-like illness Active Problems:   Sepsis (HCC)   Sepsis associated hypotension (HCC)   URI (upper respiratory infection)   Brief Summary: 52 y.o.malewith a history ofUC not on any meds who presented to the ED with a 15 day history of persistent cough productive of yellow sputum, fevers, intermittent headache, body aches, nausea, and vomiting.  He tested negative for influenza in the ED.    Assessment & Plan:  Acute sinusitis - Flu negative - procalcitonin 4 - RVP negative - HIV negative - CXR clear -symptoms appear to be localizing to the frontal or ethmoid sinuses  - CT maxillofacial confirms findings of acute sinusitis.  Suspect acute bacterial sinusitis. - Discussed with ID 1/30 and started Augmentin, recommend 10-14 days treatment.  Flonase. - Likely posttussive vomiting due to postnasal drip from acute sinusitis. - Blood cultures x2: Negative to date.  Flu panel PCR negative.  HIV screen negative. - No high fevers.  Had some low-grade fevers yesterday and night sweats last night. - Complete course of Augmentin.  Continue Flonase.  Added Claritin for decongestion and advised patient that he should not drive while taking this medication due to sedative effects.  He verbalized understanding.  PRN ibuprofen for pain and fevers.   Patient reports tolerating ibuprofen in the past. Close outpatient follow-up with PCP in the next few days.  If patient has persisting symptoms are not improving then may consider ENT consultation.  Patient is aware to seek immediate medical attention if there is any worsening of his condition. -Patient reports that he does not smoke.  Lactic acidosis -severe dehydration Resolved w/ volume expansion, but patient was not able to tolerate oral intake and therefore remained on IV fluids longer. Vomiting has improved.  Mostly when he is laying down and sleeping at night.  Reports being brought on by postnasal drip.  Likely posttussive vomiting.  Treat for sinusitis as above.  PRN cough suppressants and nausea medicines.  Hx of UC Quiescent  Anemia ?  Dilutional.    Hemoglobin stable in the last 2 days.  Etiology of anemia unclear.  Outpatient follow-up and evaluation as deemed necessary.  Leukocytosis Likely secondary to acute sinusitis.  Treatment as above.  Improved.  Microscopic hematuria -Unclear etiology.  No urinary symptoms reported.  Recommend outpatient follow-up with repeat urine microscopy in a week or 2 and further evaluation if it persists.   Consultants:  None  Procedures: None    Discharge Instructions  Discharge Instructions    Call MD for:  difficulty breathing, headache or visual disturbances   Complete by:  As directed    Call MD for:  extreme fatigue   Complete by:  As directed    Call MD for:  persistant dizziness or light-headedness   Complete by:  As directed    Call MD for:  persistant nausea and vomiting   Complete by:  As directed  Call MD for:  severe uncontrolled pain   Complete by:  As directed    Call MD for:  temperature >100.4   Complete by:  As directed    Diet - low sodium heart healthy   Complete by:  As directed    Increase activity slowly   Complete by:  As directed        Medication List    TAKE these medications    amoxicillin-clavulanate 875-125 MG tablet Commonly known as:  AUGMENTIN Take 1 tablet by mouth 2 (two) times daily. What changed:  when to take this   fluticasone 50 MCG/ACT nasal spray Commonly known as:  FLONASE Place 2 sprays into both nostrils daily.   guaiFENesin-dextromethorphan 100-10 MG/5ML syrup Commonly known as:  ROBITUSSIN DM Take 5 mLs by mouth every 6 (six) hours as needed for cough (chest congestion).   ibuprofen 800 MG tablet Commonly known as:  ADVIL,MOTRIN Take 1 tablet (800 mg total) by mouth every 8 (eight) hours as needed for fever, headache, mild pain or moderate pain.   loratadine 10 MG tablet Commonly known as:  CLARITIN Take 1 tablet (10 mg total) by mouth daily.   ondansetron 4 MG tablet Commonly known as:  ZOFRAN Take 1 tablet (4 mg total) by mouth every 8 (eight) hours as needed for nausea or vomiting.      Follow-up Information    [facility] Follow up.   Why:  Follow up appointment Wednesday Febuary 6, 2019 at 0930.  To be seen with repeat labs (CBC & BMP).  Contact information: [address] [place] [address]         Allergies  Allergen Reactions  . Aspirin       Procedures/Studies: Dg Chest 2 View  Result Date: 08/16/2017 CLINICAL DATA:  Flu-like symptoms for 2 weeks. EXAM: CHEST  2 VIEW COMPARISON:  None. FINDINGS: The heart size and mediastinal contours are within normal limits. Both lungs are clear. The visualized skeletal structures are unremarkable. IMPRESSION: No active cardiopulmonary disease.  No evidence of pneumonia. Electronically Signed   By: Bary Richard M.D.   On: 08/16/2017 01:39   Ct Maxillofacial Wo Contrast  Result Date: 08/17/2017 CLINICAL DATA:  52 y/o M; fever and sinus headaches with drainage for 12 days. EXAM: CT MAXILLOFACIAL WITHOUT CONTRAST TECHNIQUE: Multidetector CT images of the paranasal sinuses were obtained using the standard protocol without intravenous  contrast. COMPARISON:  None. FINDINGS: Paranasal sinuses: Frontal: Bilateral frontal sinus fluid levels. Opacification of frontal sinus drainage pathways. Ethmoid: Near complete opacification. Maxillary: Large bilateral fluid levels and moderate mucosal thickening. Sphenoid: Moderate mucosal thickening. Opacified sphenoid ethmoid recesses. Left lateral recess pneumatization. Right ostiomeatal unit: Opacified. Left ostiomeatal unit: Opacified. Nasal passages: Partial opacification of left-sided nasal passages may be due to polyposis or mucosal thickening. Intact nasal septum is midline. Anatomy: Pneumatization superior to anterior ethmoid notches. Symmetric and intact olfactory grooves and fovea ethmoidalis, Keros III (8-2mm) Sellar sphenoid pneumatization pattern. Dehiscence/thin bony covering of left foramen rotundum with pneumatized left lateral recess of sphenoid sinus (series 9, image 45). Other: Orbits and intracranial compartment are unremarkable. Visible mastoid air cells are normally aerated. IMPRESSION: Extensive paranasal sinus mucosal thickening and opacified sinus drainage pathways. Large fluid levels are compatible with acute sinusitis. No findings of osteitis identified. Electronically Signed   By: Mitzi Hansen M.D.   On: 08/17/2017 22:09      Subjective: Feels better.  No high fevers.  Night sweats  last night.  Cough bothering him more when he is laying in bed and sleeping at night.  One episode of vomiting yesterday but was able to keep down some food.  Discharge Exam:  Vitals:   08/18/17 1417 08/18/17 2128 08/19/17 0015 08/19/17 0455  BP: 116/66 122/73  108/69  Pulse: 72 64  61  Resp: 18 18  19   Temp: 100 F (37.8 C) 99 F (37.2 C) 99.2 F (37.3 C) 98.2 F (36.8 C)  TempSrc: Oral Oral Oral   SpO2: 95% 98%  96%  Weight:      Height:        General: No acute respiratory distress - tender over frontal sinuses bilaterally he does not appear septic or toxic. Lungs:  Clear to auscultation bilaterally without wheezes or crackles.  No increased work of breathing. Cardiovascular: Regular rate and rhythm without murmur gallop or rub normal S1 and S2 Abdomen: Nontender, nondistended, soft, bowel sounds positive, no rebound, no ascites, no appreciable mass Extremities: No cyanosis, clubbing, or edema bilateral lower extremities CNS: Alert and oriented.  No focal neurological deficits.      The results of significant diagnostics from this hospitalization (including imaging, microbiology, ancillary and laboratory) are listed below for reference.     Microbiology: Recent Results (from the past 240 hour(s))  Blood culture (routine x 2)     Status: None (Preliminary result)   Collection Time: 08/16/17 12:52 AM  Result Value Ref Range Status   Specimen Description BLOOD RIGHT HAND  Final   Special Requests   Final    BOTTLES DRAWN AEROBIC AND ANAEROBIC Blood Culture adequate volume   Culture NO GROWTH 2 DAYS  Final   Report Status PENDING  Incomplete  Blood culture (routine x 2)     Status: None (Preliminary result)   Collection Time: 08/16/17 12:59 AM  Result Value Ref Range Status   Specimen Description BLOOD LEFT HAND  Final   Special Requests   Final    BOTTLES DRAWN AEROBIC AND ANAEROBIC Blood Culture adequate volume   Culture NO GROWTH 2 DAYS  Final   Report Status PENDING  Incomplete  Respiratory Panel by PCR     Status: None   Collection Time: 08/16/17  1:55 AM  Result Value Ref Range Status   Adenovirus NOT DETECTED NOT DETECTED Final   Coronavirus 229E NOT DETECTED NOT DETECTED Final   Coronavirus HKU1 NOT DETECTED NOT DETECTED Final   Coronavirus NL63 NOT DETECTED NOT DETECTED Final   Coronavirus OC43 NOT DETECTED NOT DETECTED Final   Metapneumovirus NOT DETECTED NOT DETECTED Final   Rhinovirus / Enterovirus NOT DETECTED NOT DETECTED Final   Influenza A NOT DETECTED NOT DETECTED Final   Influenza B NOT DETECTED NOT DETECTED Final    Parainfluenza Virus 1 NOT DETECTED NOT DETECTED Final   Parainfluenza Virus 2 NOT DETECTED NOT DETECTED Final   Parainfluenza Virus 3 NOT DETECTED NOT DETECTED Final   Parainfluenza Virus 4 NOT DETECTED NOT DETECTED Final   Respiratory Syncytial Virus NOT DETECTED NOT DETECTED Final   Bordetella pertussis NOT DETECTED NOT DETECTED Final   Chlamydophila pneumoniae NOT DETECTED NOT DETECTED Final   Mycoplasma pneumoniae NOT DETECTED NOT DETECTED Final  Urine culture     Status: Abnormal   Collection Time: 08/16/17  4:43 AM  Result Value Ref Range Status   Specimen Description URINE, RANDOM  Final   Special Requests NONE  Final   Culture MULTIPLE SPECIES PRESENT, SUGGEST RECOLLECTION (A)  Final  Report Status 08/17/2017 FINAL  Final     Labs: CBC: Recent Labs  Lab 08/15/17 2341 08/16/17 0026 08/18/17 0522 08/19/17 0532  WBC 18.3*  --  17.5* 12.2*  NEUTROABS  --  13.1*  --   --   HGB 14.2  --  10.8* 10.9*  HCT 41.6  --  32.6* 32.6*  MCV 84.7  --  84.0 84.2  PLT 459*  --  399 455*   Basic Metabolic Panel: Recent Labs  Lab 08/15/17 2341 08/18/17 0522  NA 136 137  K 3.6 3.8  CL 101 106  CO2 22 23  GLUCOSE 91 111*  BUN 11 5*  CREATININE 1.09 0.91  CALCIUM 9.1 7.9*   Liver Function Tests: Recent Labs  Lab 08/16/17 0026  AST 40  ALT 41  ALKPHOS 67  BILITOT 0.7  PROT 8.0  ALBUMIN 3.3*   Urinalysis    Component Value Date/Time   COLORURINE AMBER (A) 08/16/2017 0358   APPEARANCEUR HAZY (A) 08/16/2017 0358   LABSPEC 1.035 (H) 08/16/2017 0358   PHURINE 7.0 08/16/2017 0358   GLUCOSEU NEGATIVE 08/16/2017 0358   HGBUR NEGATIVE 08/16/2017 0358   BILIRUBINUR SMALL (A) 08/16/2017 0358   KETONESUR 5 (A) 08/16/2017 0358   PROTEINUR 30 (A) 08/16/2017 0358   NITRITE NEGATIVE 08/16/2017 0358   LEUKOCYTESUR SMALL (A) 08/16/2017 0358      Time coordinating discharge: Less than 30 minutes  SIGNED:  Marcellus Scott, MD, FACP, [facility]. [facility] Pager [phone]  [phone]  If 7PM-7AM, please contact night-coverage [email or web address] Password [facility] 08/19/2017, 10:23 AM   Addendum  Responding to CDI Query  Upon review of chart, patient did meet sepsis criteria on admission.  He had fever, tachycardia, tachypnea, leukocytosis and elevated lactate.  Sepsis due to acute sinusitis.  Blood cultures x2 from 08/16/17: Negative, final report.  Marcellus Scott, MD, FACP, [facility]. [facility] Pager [phone]  If 7PM-7AM, please contact night-coverage [email or web address] Password [email or web address] 08/29/2017, 4:21 PM

## 2017-08-19 NOTE — Discharge Instructions (Signed)
Please get your medications reviewed and adjusted by your Primary MD. ° °Please request your Primary MD to go over all Hospital Tests and Procedure/Radiological results at the follow up, please get all Hospital records sent to your Prim MD by signing hospital release before you go home. ° °If you had Pneumonia of Lung problems at the Hospital: °Please get a 2 view Chest X ray done in 6-8 weeks after hospital discharge or sooner if instructed by your Primary MD. ° °If you have Congestive Heart Failure: °Please call your Cardiologist or Primary MD anytime you have any of the following symptoms:  °1) 3 pound weight gain in 24 hours or 5 pounds in 1 week  °2) shortness of breath, with or without a dry hacking cough  °3) swelling in the hands, feet or stomach  °4) if you have to sleep on extra pillows at night in order to breathe ° °Follow cardiac low salt diet and 1.5 lit/day fluid restriction. ° °If you have diabetes °Accuchecks 4 times/day, Once in AM empty stomach and then before each meal. °Log in all results and show them to your primary doctor at your next visit. °If any glucose reading is under 80 or above 300 call your primary MD immediately. ° °If you have Seizure/Convulsions/Epilepsy: °Please do not drive, operate heavy machinery, participate in activities at heights or participate in high speed sports until you have seen by Primary MD or a Neurologist and advised to do so again. ° °If you had Gastrointestinal Bleeding: °Please ask your Primary MD to check a complete blood count within one week of discharge or at your next visit. Your endoscopic/colonoscopic biopsies that are pending at the time of discharge, will also need to followed by your Primary MD. ° °Get Medicines reviewed and adjusted. °Please take all your medications with you for your next visit with your Primary MD ° °Please request your Primary MD to go over all hospital tests and procedure/radiological results at the follow up, please ask your  Primary MD to get all Hospital records sent to his/her office. ° °If you experience worsening of your admission symptoms, develop shortness of breath, life threatening emergency, suicidal or homicidal thoughts you must seek medical attention immediately by calling 911 or calling your MD immediately  if symptoms less severe. ° °You must read complete instructions/literature along with all the possible adverse reactions/side effects for all the Medicines you take and that have been prescribed to you. Take any new Medicines after you have completely understood and accpet all the possible adverse reactions/side effects.  ° °Do not drive or operate heavy machinery when taking Pain medications.  ° °Do not take more than prescribed Pain, Sleep and Anxiety Medications ° °Special Instructions: If you have smoked or chewed Tobacco  in the last 2 yrs please stop smoking, stop any regular Alcohol  and or any Recreational drug use. ° °Wear Seat belts while driving. ° °Please note °You were cared for by a hospitalist during your hospital stay. If you have any questions about your discharge medications or the care you received while you were in the hospital after you are discharged, you can call the unit and asked to speak with the hospitalist on call if the hospitalist that took care of you is not available. Once you are discharged, your primary care physician will handle any further medical issues. Please note that NO REFILLS for any discharge medications will be authorized once you are discharged, as it is imperative that you   return to your primary care physician (or establish a relationship with a primary care physician if you do not have one) for your aftercare needs so that they can reassess your need for medications and monitor your lab values.  You can reach the hospitalist office at phone [phone] or fax [phone]   If you do not have a primary care physician, you can call [phone] for a physician  referral.   Sinusitis, Adult Sinusitis is soreness and inflammation of your sinuses. Sinuses are hollow spaces in the bones around your face. Your sinuses are located:  Around your eyes.  In the middle of your forehead.  Behind your nose.  In your cheekbones.  Your sinuses and nasal passages are lined with a stringy fluid (mucus). Mucus normally drains out of your sinuses. When your nasal tissues become inflamed or swollen, the mucus can become trapped or blocked so air cannot flow through your sinuses. This allows bacteria, viruses, and funguses to grow, which leads to infection. Sinusitis can develop quickly and last for 7?10 days (acute) or for more than 12 weeks (chronic). Sinusitis often develops after a cold. What are the causes? This condition is caused by anything that creates swelling in the sinuses or stops mucus from draining, including:  Allergies.  Asthma.  Bacterial or viral infection.  Abnormally shaped bones between the nasal passages.  Nasal growths that contain mucus (nasal polyps).  Narrow sinus openings.  Pollutants, such as chemicals or irritants in the air.  A foreign object stuck in the nose.  A fungal infection. This is rare.  What increases the risk? The following factors may make you more likely to develop this condition:  Having allergies or asthma.  Having had a recent cold or respiratory tract infection.  Having structural deformities or blockages in your nose or sinuses.  Having a weak immune system.  Doing a lot of swimming or diving.  Overusing nasal sprays.  Smoking.  What are the signs or symptoms? The main symptoms of this condition are pain and a feeling of pressure around the affected sinuses. Other symptoms include:  Upper toothache.  Earache.  Headache.  Bad breath.  Decreased sense of smell and taste.  A cough that may get worse at night.  Fatigue.  Fever.  Thick drainage from your nose. The drainage is  often green and it may contain pus (purulent).  Stuffy nose or congestion.  Postnasal drip. This is when extra mucus collects in the throat or back of the nose.  Swelling and warmth over the affected sinuses.  Sore throat.  Sensitivity to light.  How is this diagnosed? This condition is diagnosed based on symptoms, a medical history, and a physical exam. To find out if your condition is acute or chronic, your health care provider may:  Look in your nose for signs of nasal polyps.  Tap over the affected sinus to check for signs of infection.  View the inside of your sinuses using an imaging device that has a light attached (endoscope).  If your health care provider suspects that you have chronic sinusitis, you may also:  Be tested for allergies.  Have a sample of mucus taken from your nose (nasal culture) and checked for bacteria.  Have a mucus sample examined to see if your sinusitis is related to an allergy.  If your sinusitis does not respond to treatment and it lasts longer than 8 weeks, you may have an MRI or CT scan to check your sinuses. These scans  also help to determine how severe your infection is. In rare cases, a bone biopsy may be done to rule out more serious types of fungal sinus disease. How is this treated? Treatment for sinusitis depends on the cause and whether your condition is chronic or acute. If a virus is causing your sinusitis, your symptoms will go away on their own within 10 days. You may be given medicines to relieve your symptoms, including:  Topical nasal decongestants. They shrink swollen nasal passages and let mucus drain from your sinuses.  Antihistamines. These drugs block inflammation that is triggered by allergies. This can help to ease swelling in your nose and sinuses.  Topical nasal corticosteroids. These are nasal sprays that ease inflammation and swelling in your nose and sinuses.  Nasal saline washes. These rinses can help to get rid of  thick mucus in your nose.  If your condition is caused by bacteria, you will be given an antibiotic medicine. If your condition is caused by a fungus, you will be given an antifungal medicine. Surgery may be needed to correct underlying conditions, such as narrow nasal passages. Surgery may also be needed to remove polyps. Follow these instructions at home: Medicines  Take, use, or apply over-the-counter and prescription medicines only as told by your health care provider. These may include nasal sprays.  If you were prescribed an antibiotic medicine, take it as told by your health care provider. Do not stop taking the antibiotic even if you start to feel better. Hydrate and Humidify  Drink enough water to keep your urine clear or pale yellow. Staying hydrated will help to thin your mucus.  Use a cool mist humidifier to keep the humidity level in your home above 50%.  Inhale steam for 10-15 minutes, 3-4 times a day or as told by your health care provider. You can do this in the bathroom while a hot shower is running.  Limit your exposure to cool or dry air. Rest  Rest as much as possible.  Sleep with your head raised (elevated).  Make sure to get enough sleep each night. General instructions  Apply a warm, moist washcloth to your face 3-4 times a day or as told by your health care provider. This will help with discomfort.  Wash your hands often with soap and water to reduce your exposure to viruses and other germs. If soap and water are not available, use hand sanitizer.  Do not smoke. Avoid being around people who are smoking (secondhand smoke).  Keep all follow-up visits as told by your health care provider. This is important. Contact a health care provider if:  You have a fever.  Your symptoms get worse.  Your symptoms do not improve within 10 days. Get help right away if:  You have a severe headache.  You have persistent vomiting.  You have pain or swelling around  your face or eyes.  You have vision problems.  You develop confusion.  Your neck is stiff.  You have trouble breathing. This information is not intended to replace advice given to you by your health care provider. Make sure you discuss any questions you have with your health care provider. Document Released: 07/06/2005 Document Revised: 03/01/2016 Document Reviewed: 05/01/2015 Elsevier Interactive Patient Education  [employer].

## 2017-08-21 LAB — CULTURE, BLOOD (ROUTINE X 2)
CULTURE: NO GROWTH
Culture: NO GROWTH
SPECIAL REQUESTS: ADEQUATE
Special Requests: ADEQUATE

## 2017-08-25 ENCOUNTER — Ambulatory Visit: Payer: 59 | Admitting: Family Medicine

## 2019-11-19 IMAGING — CT CT MAXILLOFACIAL W/O CM
3 of 6 series · 16 of 47 positions shown, 19 images · non-contrast
Comparison: None.

CLINICAL DATA: 51 y/o M; fever and sinus headaches with drainage
for 12 days.

EXAM:
CT MAXILLOFACIAL WITHOUT CONTRAST
TECHNIQUE: Multidetector CT images of the paranasal sinuses were obtained using
the standard protocol without intravenous contrast.

[Series 3: maxilllofacial 2.0 hr40 3 · axial · 0.29mm/px · z∈[-220,-62]mm · 11 of 93 slices shown, 14 images]
[im 7/93  brain]
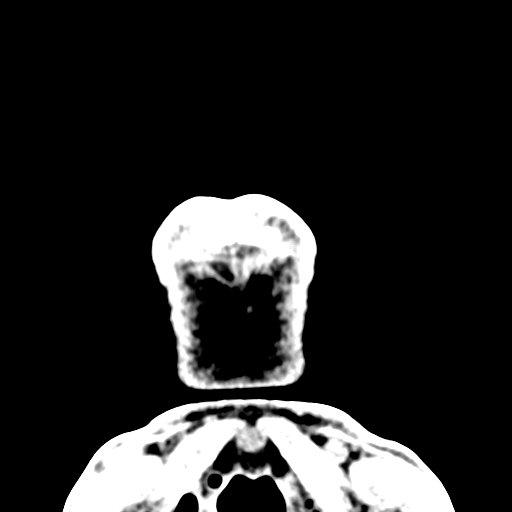
[im 7/93  bone]
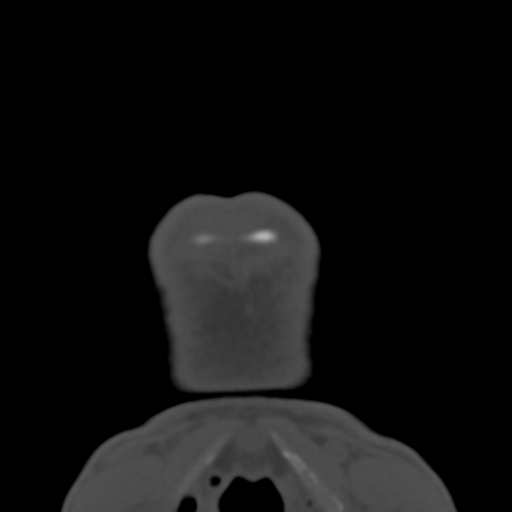
[im 14/93  bone]
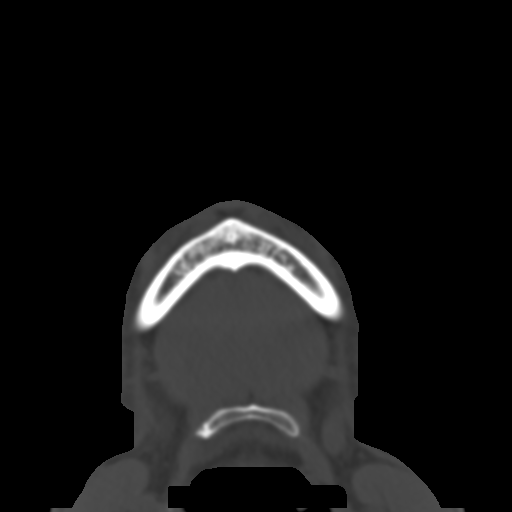
[im 20/93  bone]
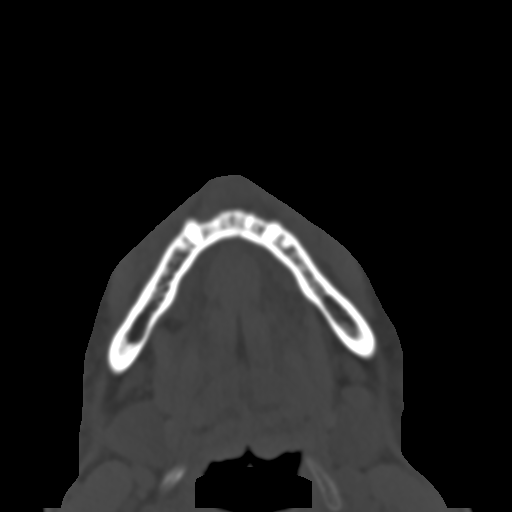
[im 33/93  bone]
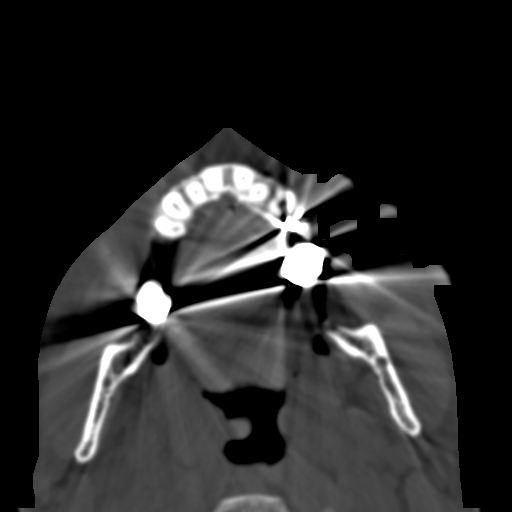
[im 40/93  brain]
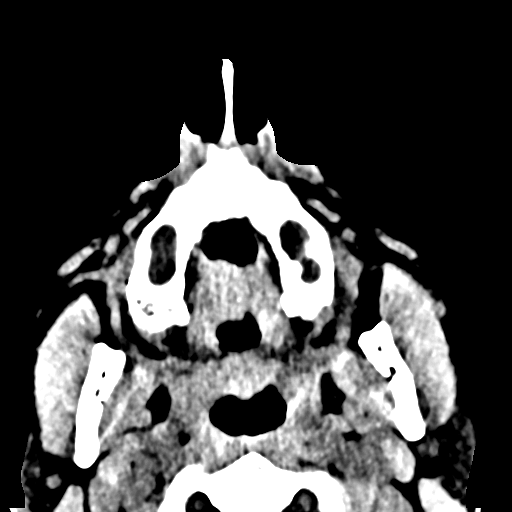
[im 40/93  bone]
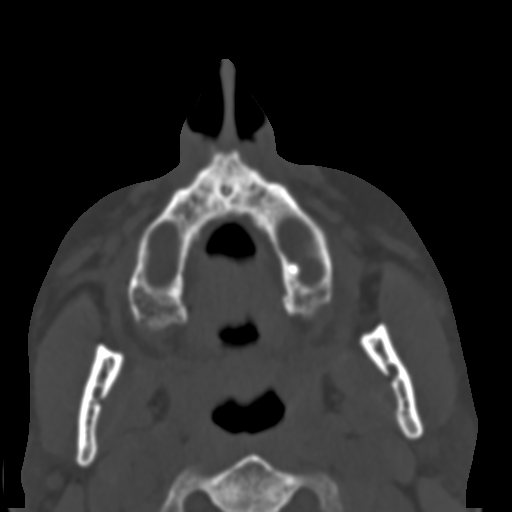
[im 47/93  bone]
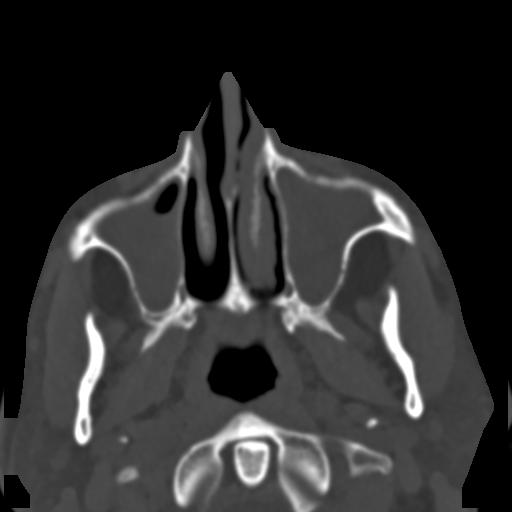
[im 53/93  bone]
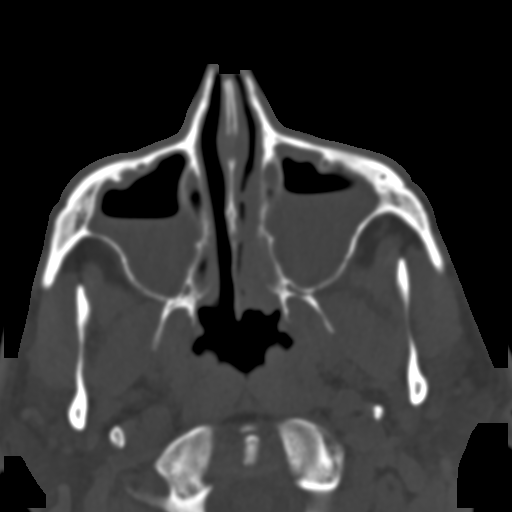
[im 60/93  bone]
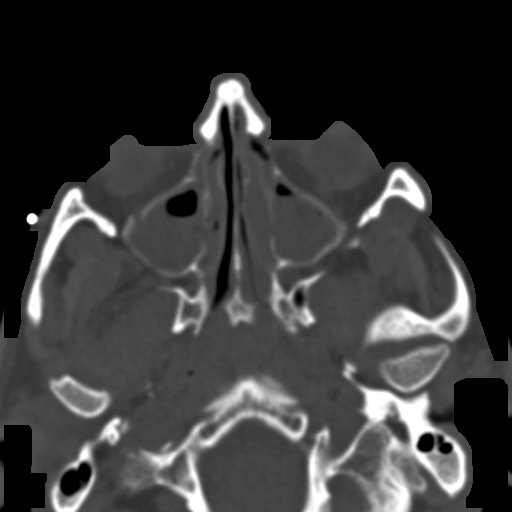
[im 73/93  brain]
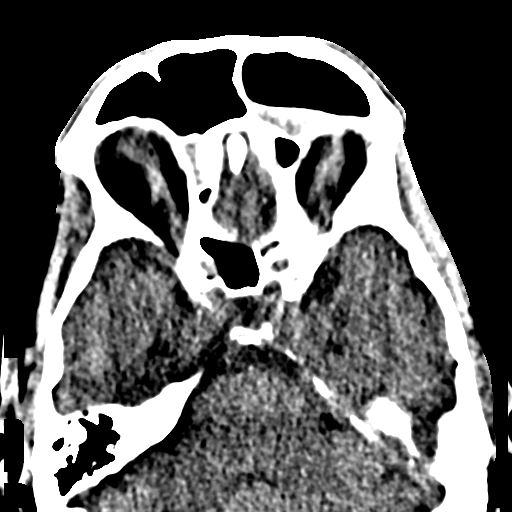
[im 73/93  bone]
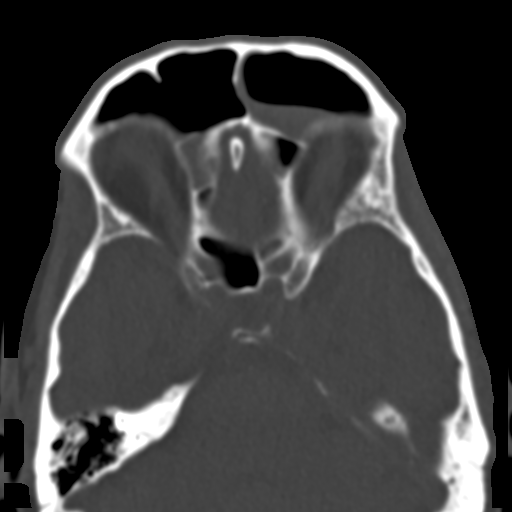
[im 79/93  bone]
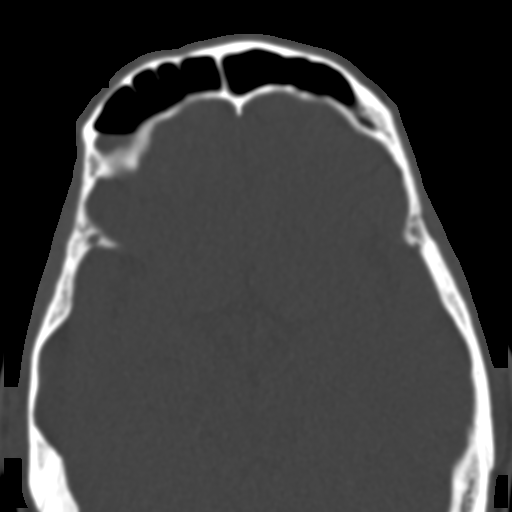
[im 86/93  bone]
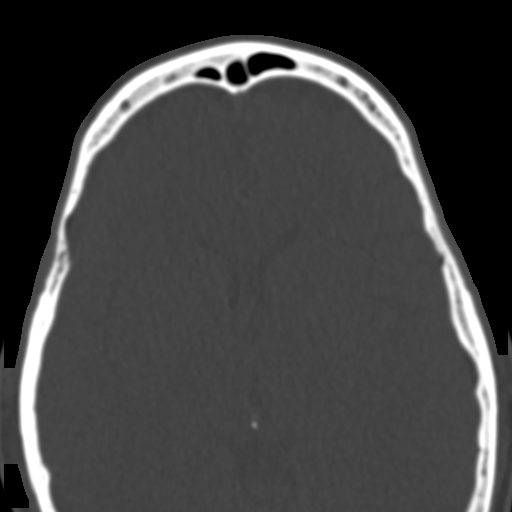

[Series 7: st cor · coronal · 0.39mm/px · 3 of 79 slices shown]
[im 20/79  bone]
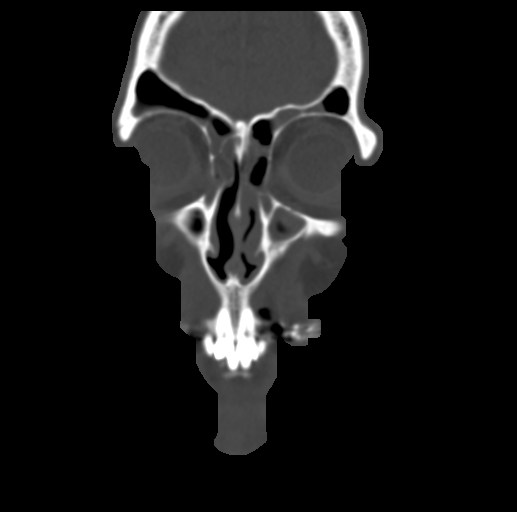
[im 40/79  bone]
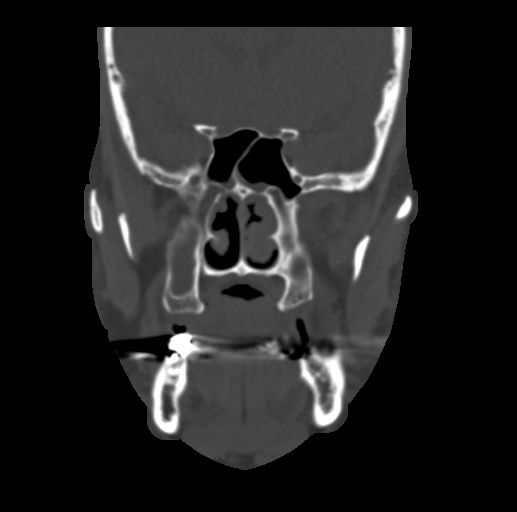
[im 59/79  bone]
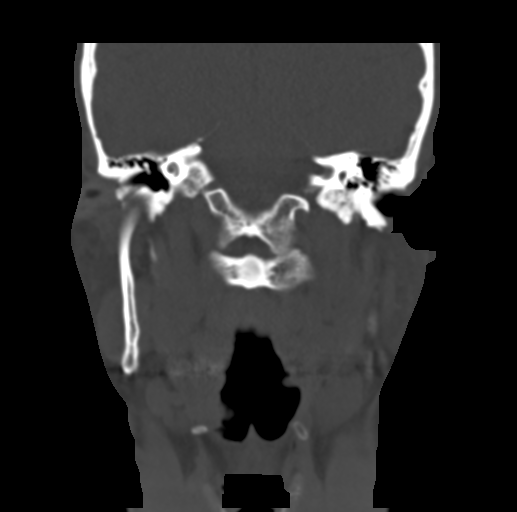

[Series 10: bone sag · sagittal · 0.35mm/px · 2 of 79 slices shown]
[im 27/79  bone]
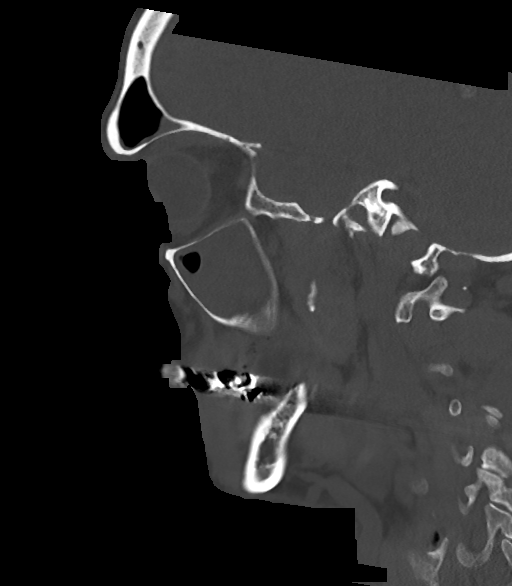
[im 53/79  bone]
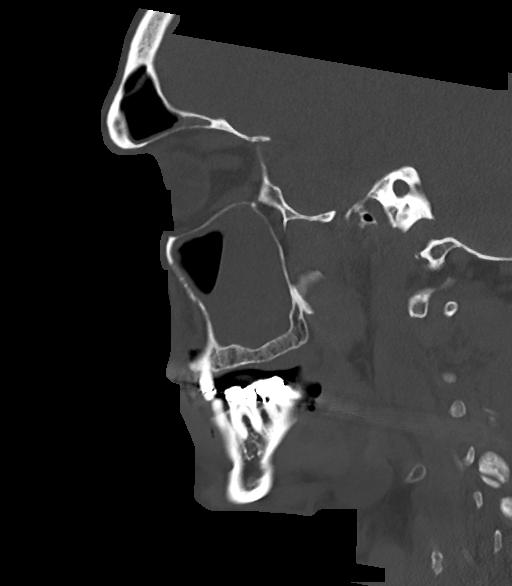

[16 of 47 positions shown; findings below may reference images not displayed]

FINDINGS: Paranasal sinuses:

Frontal: Bilateral frontal sinus fluid levels. Opacification of
frontal sinus drainage pathways.

Ethmoid: Near complete opacification.

Maxillary: Large bilateral fluid levels and moderate mucosal
thickening.

Sphenoid: Moderate mucosal thickening. Opacified sphenoid ethmoid
recesses. Left lateral recess pneumatization.

Right ostiomeatal unit: Opacified.

Left ostiomeatal unit: Opacified.

Nasal passages: Partial opacification of left-sided nasal passages
may be due to polyposis or mucosal thickening. Intact nasal septum
is midline.

Anatomy: Pneumatization superior to anterior ethmoid notches.
Symmetric and intact olfactory grooves and fovea ethmoidalis, Keros
III (8-16mm) Sellar sphenoid pneumatization pattern. Dehiscence/thin
bony covering of left foramen rotundum with pneumatized left lateral
recess of sphenoid sinus (series 9, image 45).

Other: Orbits and intracranial compartment are unremarkable. Visible
mastoid air cells are normally aerated.
IMPRESSION: Extensive paranasal sinus mucosal thickening and opacified sinus
drainage pathways. Large fluid levels are compatible with acute
sinusitis. No findings of osteitis identified.

By: Ingunn Harpa Ronlor M.D.
